# Patient Record
Sex: Female | Born: 1965 | Race: Asian | Hispanic: No | Marital: Married | Smoking: Never smoker
Health system: Southern US, Community
[De-identification: ages and names within clinical notes are randomized; demographics above are authoritative.]

---

## 2014-01-27 ENCOUNTER — Other Ambulatory Visit: Payer: Self-pay | Admitting: Family Medicine

## 2014-01-27 DIAGNOSIS — Z1231 Encounter for screening mammogram for malignant neoplasm of breast: Secondary | ICD-10-CM

## 2014-01-29 ENCOUNTER — Ambulatory Visit (HOSPITAL_COMMUNITY)
Admission: RE | Admit: 2014-01-29 | Discharge: 2014-01-29 | Disposition: A | Discharge status: Home / Self Care | Payer: BC Managed Care – PPO | Source: Ambulatory Visit | Attending: Family Medicine | Admitting: Family Medicine

## 2014-01-29 DIAGNOSIS — Z1231 Encounter for screening mammogram for malignant neoplasm of breast: Secondary | ICD-10-CM | POA: Insufficient documentation

## 2014-11-16 ENCOUNTER — Ambulatory Visit (INDEPENDENT_AMBULATORY_CARE_PROVIDER_SITE_OTHER): Payer: 59 | Admitting: Internal Medicine

## 2014-11-16 VITALS — BP 124/74 | HR 65 | Temp 98.5°F | Resp 16 | Ht 61.0 in | Wt 118.2 lb

## 2014-11-16 DIAGNOSIS — D049 Carcinoma in situ of skin, unspecified: Secondary | ICD-10-CM

## 2014-11-16 DIAGNOSIS — R0789 Other chest pain: Secondary | ICD-10-CM | POA: Diagnosis not present

## 2014-11-16 DIAGNOSIS — D039 Melanoma in situ, unspecified: Secondary | ICD-10-CM

## 2014-11-16 DIAGNOSIS — B029 Zoster without complications: Secondary | ICD-10-CM | POA: Diagnosis not present

## 2014-11-16 MED ORDER — VALACYCLOVIR HCL 1 G PO TABS: 1000.0000 mg | ORAL_TABLET | Freq: Three times a day (TID) | ORAL | Status: DC

## 2014-11-16 MED ORDER — IBUPROFEN 600 MG PO TABS: 600.0000 mg | ORAL_TABLET | Freq: Three times a day (TID) | ORAL | Status: DC | PRN

## 2014-11-16 NOTE — Patient Instructions (Signed)

## 2014-11-16 NOTE — Progress Notes (Signed)
Patient ID: Kaylee Sharp, female   DOB: 24-Nov-1965, 49 y.o.   MRN: [id number]   11/16/2014 at 4:25 PM  Kaylee Sharp / DOB: 24-Nov-1965 / MRN: [id number]  Problem list reviewed and updated by me where necessary.   SUBJECTIVE  Kaylee Sharp is a 49 y.o. well appearing female presenting for the chief complaint of acute painful.rash right axullae. She has single large plaque with many vesicles all intact. She also co 6 months of increasing  broan macules all over back and front.She has no chronic diseases and only takes nexium and claritin prn.   She  has no past medical history on file.    Medications reviewed and updated by myself where necessary, and exist elsewhere in the encounter.   [name] has No Known Allergies. She  reports that she has never smoked. She does not have any smokeless tobacco history on file. She  has no sexual activity history on file. The patient  has no past surgical history on file.  Her family history is not on file.  Review of Systems  Constitutional: Negative for fever, chills and malaise/fatigue.  Respiratory: Negative for shortness of breath.   Cardiovascular: Positive for chest pain.  Gastrointestinal: Negative for nausea.  Skin: Positive for rash.  Neurological: Positive for tingling. Negative for dizziness, weakness and headaches.    OBJECTIVE  Her  height is 5\' 1"  (1.549 m) and weight is 118 lb 3.2 oz (53.615 kg). Her oral temperature is 98.5 F (36.9 C). Her blood pressure is 124/74 and her pulse is 65. Her respiration is 16 and oxygen saturation is 98%.  The patient's body mass index is 22.35 kg/(m^2).  Physical Exam  Constitutional: She is oriented to person, place, and time. She appears well-developed and well-nourished.  HENT:  Head: Normocephalic.  Eyes: Conjunctivae are normal. No scleral icterus.  Neck: Normal range of motion.  Cardiovascular: Normal rate.   Respiratory: Effort normal.  GI: She exhibits no distension.  Musculoskeletal: Normal  range of motion.  Neurological: She is alert and oriented to person, place, and time. Coordination normal.  Skin: Skin is warm and dry.  Psychiatric: She has a normal mood and affect.    No results found for this or any previous visit (from the past 24 hour(s)).  ASSESSMENT & PLAN  Kaylee Sharp was seen today for rash and black spots.  Diagnoses and all orders for this visit:  Herpes zoster -     valACYclovir (VALTREX) 1000 MG tablet; Take 1 tablet (1,000 mg total) by mouth 3 (three) times daily. -     ibuprofen (ADVIL,MOTRIN) 600 MG tablet; Take 1 tablet (600 mg total) by mouth every 8 (eight) hours as needed.  Melanotic freckle -     Ambulatory referral to Dermatology  Right-sided chest wall pain -     valACYclovir (VALTREX) 1000 MG tablet; Take 1 tablet (1,000 mg total) by mouth 3 (three) times daily. -     ibuprofen (ADVIL,MOTRIN) 600 MG tablet; Take 1 tablet (600 mg total) by mouth every 8 (eight) hours as needed.

## 2015-01-13 ENCOUNTER — Ambulatory Visit (INDEPENDENT_AMBULATORY_CARE_PROVIDER_SITE_OTHER): Payer: 59 | Admitting: Family Medicine

## 2015-01-13 ENCOUNTER — Ambulatory Visit (INDEPENDENT_AMBULATORY_CARE_PROVIDER_SITE_OTHER): Payer: 59

## 2015-01-13 VITALS — BP 108/80 | HR 75 | Temp 98.1°F | Resp 18 | Ht 61.0 in | Wt 117.2 lb

## 2015-01-13 DIAGNOSIS — M654 Radial styloid tenosynovitis [de Quervain]: Secondary | ICD-10-CM

## 2015-01-13 DIAGNOSIS — Z23 Encounter for immunization: Secondary | ICD-10-CM | POA: Diagnosis not present

## 2015-01-13 DIAGNOSIS — M25532 Pain in left wrist: Secondary | ICD-10-CM

## 2015-01-13 DIAGNOSIS — Z13220 Encounter for screening for lipoid disorders: Secondary | ICD-10-CM

## 2015-01-13 DIAGNOSIS — M25522 Pain in left elbow: Secondary | ICD-10-CM | POA: Diagnosis not present

## 2015-01-13 DIAGNOSIS — Z131 Encounter for screening for diabetes mellitus: Secondary | ICD-10-CM | POA: Diagnosis not present

## 2015-01-13 LAB — COMPLETE METABOLIC PANEL WITH GFR
ALBUMIN: 4.4 g/dL (ref 3.6–5.1)
ALK PHOS: 41 U/L (ref 33–115)
ALT: 22 U/L (ref 6–29)
AST: 20 U/L (ref 10–35)
BILIRUBIN TOTAL: 0.7 mg/dL (ref 0.2–1.2)
BUN: 8 mg/dL (ref 7–25)
CO2: 27 mmol/L (ref 20–31)
CREATININE: 0.6 mg/dL (ref 0.50–1.10)
Calcium: 9.3 mg/dL (ref 8.6–10.2)
Chloride: 105 mmol/L (ref 98–110)
GFR, Est African American: >89 mL/min (ref >=60)
GFR, Est Non African American: >89 mL/min (ref >=60)
Glucose, Bld: 88 mg/dL (ref 65–99)
Potassium: 4.6 mmol/L (ref 3.5–5.3)
Sodium: 140 mmol/L (ref 135–146)
Total Protein: 6.9 g/dL (ref 6.1–8.1)

## 2015-01-13 LAB — LIPID PANEL
Cholesterol: 210 mg/dL — ABNORMAL HIGH (ref 125–200)
HDL: 49 mg/dL (ref >=46)
LDL CALC: 123 mg/dL (ref <130)
TRIGLYCERIDES: 188 mg/dL — AB (ref <150)
Total CHOL/HDL Ratio: 4.3 Ratio (ref <=5.0)
VLDL: 38 mg/dL — ABNORMAL HIGH (ref <30)

## 2015-01-13 MED ORDER — MELOXICAM 7.5 MG PO TABS: 7.5000 mg | ORAL_TABLET | Freq: Every day | ORAL | Status: AC

## 2015-01-13 NOTE — Progress Notes (Signed)
Subjective:  By signing my name below, I, Kaylee Sharp, attest that this documentation has been prepared under the direction and in the presence of Meredith Staggers, MD.  Broadus John, Medical Scribe. 01/13/2015.  12:10 PM.   Patient ID: Kaylee Sharp, female    DOB: [birth year], 49 y.o.   MRN: [id number]  Chief Complaint  Patient presents with  . Arm Pain    left arm pain, x 2 months  . Flu Vaccine  . Other    requesting blood work    HPI HPI Comments: Kaylee Sharp is a 49 y.o. female who presents to [facility] complaining of left arm pain, starting onset 2 months ago.  She states that she hit her arm with an object initially, and on another incident she hit her elbow on the same arm. She indicates that she is experiencing a worsening soreness of the area that is resulting in a mild weakness of the area. She states that the pain radiates to her upper arm, however not to the shoulder or the neck, and also notes pain with pushing off on her left wrist. . Pt is right hand dominant. She denies working on a computer for a long periods of time.   Pt is requesting blood work to be done on her for a regular check up for cholesterol and diabetes . Pt does not have a PCP, she does not recall the last time she had a physical exam done.  She is in a fasting state today.    Pt is requesting flu vaccine.  Pt works in [employer].   Pt does have a language barrier, her first language is [place], however she voiced understanding of the treatment plan.    There are no active problems to display for this patient.  History reviewed. No pertinent past medical history. History reviewed. No pertinent past surgical history. No Known Allergies Prior to Admission medications   Medication Sig Start Date End Date Taking? Authorizing Provider  esomeprazole (NEXIUM) 20 MG packet Take 20 mg by mouth daily before breakfast.   Yes Historical Provider, MD  ibuprofen (ADVIL,MOTRIN) 600 MG tablet  Take 1 tablet (600 mg total) by mouth every 8 (eight) hours as needed. Patient not taking: Reported on 01/13/2015 11/16/14   Jonita Albee, MD  valACYclovir (VALTREX) 1000 MG tablet Take 1 tablet (1,000 mg total) by mouth 3 (three) times daily. Patient not taking: Reported on 01/13/2015 11/16/14   Jonita Albee, MD   Social History   Social History  . Marital Status: Married    Spouse Name: N/A  . Number of Children: N/A  . Years of Education: N/A   Occupational History  . Not on file.   Social History Main Topics  . Smoking status: Never Smoker   . Smokeless tobacco: Not on file  . Alcohol Use: Not on file  . Drug Use: Not on file  . Sexual Activity: Not on file   Other Topics Concern  . Not on file   Social History Narrative    Review of Systems  Musculoskeletal: Positive for myalgias and arthralgias. Negative for neck pain.  Neurological: Positive for weakness.      Objective:   Physical Exam  Constitutional: She is oriented to person, place, and time. She appears well-developed and well-nourished. No distress.  HENT:  Head: Normocephalic and atraumatic.  Eyes: EOM are normal. Pupils are equal, round, and reactive to light.  Neck: Neck supple.  Cardiovascular:  Normal rate.   Pulmonary/Chest: Effort normal.  Musculoskeletal:  FROM. Reproduction of upper shoulder and upper back pain, she does not necessarily feel the pain radiating to her left arm, but tenderness over the left trapezius and left lateral neck. Left shoulder- FROM. Full rotator cuffs strength. Full strength on the left upper extremity compared to her right. Full grip strength.  Left hand- no focal bony tenderness including scaphoid on the thump. Negative tinel's. FROM. Positive finkelsteins. Globally points to the posterior elbow for pain, but FROM. No bony tenderness on my exam.   Neurological: She is alert and oriented to person, place, and time. No cranial nerve deficit.  Skin: Skin is warm and dry.    Psychiatric: She has a normal mood and affect. Her behavior is normal.  Nursing note and vitals reviewed.   Filed Vitals:   01/13/15 1134  BP: 108/80  Pulse: 75  Temp: 98.1 F (36.7 C)  TempSrc: Oral  Resp: 18  Height: 5\' 1"  (1.549 m)  Weight: 117 lb 3.2 oz (53.162 kg)  SpO2: 98%    [facility] (PRIMARY) x-ray report read by Dr. Meredith StaggersJeffrey Skyra Crichlow, MD: Left elbow- no apparent fracture, or significant fat pad.  Left wrist- no fracture. No acute findings.      Assessment & Plan:   Kaylee SlickerJuhee Kawano is a [age] y.o. female Left wrist pain Tenosynovitis, de Quervain - Plan: Care order/instruction, meloxicam (MOBIC) 7.5 MG tablet  -Possible initial contusion, but symptoms and exam suggests more de Quervain's disease. Thumb spica splint placed temporarily, range of motion 2 times per day, RTC precautions if not improving the next 2 weeks.  Left elbow pain - Plan: DG Elbow Complete Left, meloxicam (MOBIC) 7.5 MG tablet  -No apparent fracture on x-ray, possible initial contusion. Symptomatic care with meloxicam, follow-up in 2 weeks if not improved then would consider ortho evaluation.  Screening for hyperlipidemia - Plan: Lipid panel  Screening for diabetes mellitus - Plan: COMPLETE METABOLIC PANEL WITH GFR  Flu vaccine need - Plan: Flu Vaccine QUAD 36+ mos IM given.  Screening labs drawn above at her request, but recommended scheduling physical to review remainder of health maintenance or necessary screening tests.     Meds ordered this encounter  Medications  . meloxicam (MOBIC) 7.5 MG tablet    Sig: Take 1 tablet (7.5 mg total) by mouth daily.    Dispense:  30 tablet    Refill:  0   Patient Instructions  You should receive a call or letter about your lab results within the next week to 10 days.  Schedule a physical for other health maintenance besides screening for diabetes and cholesterol that we checked today.  Meloxicam once per day as needed for your elbow, wrist, arm pain. Okay to use  the brace for up to 2 weeks for your left wrist, but come out of the brace at least twice per day for stretches and range of motion. Range of motion or stretches for your neck and heat to the back of your neck and upper back as needed. Recheck in 2 weeks if not better, sooner if worse  Flu vaccine given today.   Return to the clinic or go to the nearest emergency room if any of your symptoms worsen or new symptoms occur.  Lollie Sailse Quervain Disease Lollie Sailse Quervain disease is inflammation of the tendon on the thumb side of the wrist. Tendons are cords of tissue that connect bones to muscles. The tendons in your hand pass through a tunnel,  or sheath. A slippery layer of tissue (synovium) lets the tendons move smoothly in the sheath. With de Quervain disease, the sheath swells or thickens, causing friction and pain. The condition is also called de Quervain tendinosis and de Quervain syndrome. It occurs most often in women who are 26-62 years old. CAUSES  The exact cause of de Quervain disease is not known. It may result from:   Overusing your hands, especially with repetitive motions that involve twisting your hand or using a forceful grip.  Pregnancy.  Rheumatoid disease. RISK FACTORS You may have a greater risk for de Quervain disease if you:  Are a middle-aged woman.  Are pregnant.  Have rheumatoid arthritis.  Have diabetes.  Use your hands far more than normal, especially with a tight grip or excessive twisting. SIGNS AND SYMPTOMS Pain on the thumb side of your wrist is the main symptom of de Quervain disease. Other signs and symptoms include:  Pain that gets worse when you grasp something or turn your wrist.  Pain that extends up the forearm.  Cysts in the area of the pain.  Swelling of your wrist and hand.  A sensation of snapping in the wrist.  Trouble moving the thumb and wrist. DIAGNOSIS  Your health care provider may diagnose de Quervain disease based on your signs and  symptoms. A physical exam will also be done. A simple test Lourena Simmonds test) that involves pulling your thumb and wrist to see if this causes pain can help determine whether you have the condition. Sometimes you may need to have an X-ray.  TREATMENT  Avoiding any activity that causes pain and swelling is the best treatment. Other options include:  Wearing a splint.  Taking medicine. Anti-inflammatory medicines and corticosteroid injections may reduce inflammation and relieve pain.  Having surgery if other treatments do not work. HOME CARE INSTRUCTIONS   Using ice can be helpful after doing activities that involve the sore wrist. To apply ice to the injured area:  Put ice in a plastic bag.  Place a towel between your skin and the bag.  Leave the ice on for 20 minutes, 2-3 times a day.  Take medicines only as directed by your health care provider.  Wear your splint as directed. This will allow your hand to rest and heal. SEEK MEDICAL CARE IF:   Your pain medicine does not help.   Your pain gets worse.  You develop new symptoms. MAKE SURE YOU:   Understand these instructions.  Will watch your condition.  Will get help right away if you are not doing well or get worse.   This information is not intended to replace advice given to you by your health care provider. Make sure you discuss any questions you have with your health care provider.   Document Released: 10/12/2000 Document Revised: 02/07/2014 Document Reviewed: 05/22/2013 Elsevier Interactive Patient Education 2016 Elsevier Inc.   Elbow Contusion An elbow contusion is a deep bruise of the elbow. Contusions are the result of an injury that caused bleeding under the skin. The contusion may turn blue, purple, or yellow. Minor injuries will give you a painless contusion, but more severe contusions may stay painful and swollen for a few weeks.  CAUSES  An elbow contusion comes from a direct force to that area, such as  falling on the elbow. SYMPTOMS   Swelling and redness of the elbow.  Bruising of the elbow area.  Tenderness or soreness of the elbow. DIAGNOSIS  You will have a  physical exam and will be asked about your history. You may need an X-ray of your elbow to look for a broken bone (fracture).  TREATMENT  A sling or splint may be needed to support your injury. Resting, elevating, and applying cold compresses to the elbow area are often the best treatments for an elbow contusion. Over-the-counter medicines may also be recommended for pain control. HOME CARE INSTRUCTIONS   Put ice on the injured area.  Put ice in a plastic bag.  Place a towel between your skin and the bag.  Leave the ice on for 15-20 minutes, 03-04 times a day.  Only take over-the-counter or prescription medicines for pain, discomfort, or fever as directed by your caregiver.  Rest your injured elbow until the pain and swelling are better.  Elevate your elbow to reduce swelling.  Apply a compression wrap as directed by your caregiver. This can help reduce swelling and motion. You may remove the wrap for sleeping, showers, and baths. If your fingers become numb, cold, or blue, take the wrap off and reapply it more loosely.  Use your elbow only as directed by your caregiver. You may be asked to do range of motion exercises. Do them as directed.  See your caregiver as directed. It is very important to keep all follow-up appointments in order to avoid any long-term problems with your elbow, including chronic pain or inability to move your elbow normally. SEEK IMMEDIATE MEDICAL CARE IF:   You have increased redness, swelling, or pain in your elbow.  Your swelling or pain is not relieved with medicines.  You have swelling of the hand and fingers.  You are unable to move your fingers or wrist.  You begin to lose feeling in your hand or fingers.  Your fingers or hand become cold or blue. MAKE SURE YOU:   Understand  these instructions.  Will watch your condition.  Will get help right away if you are not doing well or get worse.   This information is not intended to replace advice given to you by your health care provider. Make sure you discuss any questions you have with your health care provider.   Document Released: 12/26/2005 Document Revised: 04/11/2011 Document Reviewed: 09/01/2014 Elsevier Interactive Patient Education 2016 Elsevier Inc. Influenza Virus Vaccine injection What is this medicine? INFLUENZA VIRUS VACCINE (in floo EN zuh VAHY ruhs vak SEEN) helps to reduce the risk of getting influenza also known as the flu. The vaccine only helps protect you against some strains of the flu. This medicine may be used for other purposes; ask your health care provider or pharmacist if you have questions. What should I tell my health care provider before I take this medicine? They need to know if you have any of these conditions: -bleeding disorder like hemophilia -fever or infection -Guillain-Barre syndrome or other neurological problems -immune system problems -infection with the human immunodeficiency virus (HIV) or AIDS -low blood platelet counts -multiple sclerosis -an unusual or allergic reaction to influenza virus vaccine, latex, other medicines, foods, dyes, or preservatives. Different brands of vaccines contain different allergens. Some may contain latex or eggs. Talk to your doctor about your allergies to make sure that you get the right vaccine. -pregnant or trying to get pregnant -breast-feeding How should I use this medicine? This vaccine is for injection into a muscle or under the skin. It is given by a health care professional. A copy of Vaccine Information Statements will be given before each vaccination. Read this sheet  carefully each time. The sheet may change frequently. Talk to your healthcare provider to see which vaccines are right for you. Some vaccines should not be used in  all age groups. Overdosage: If you think you have taken too much of this medicine contact a poison control center or emergency room at once. NOTE: This medicine is only for you. Do not share this medicine with others. What if I miss a dose? This does not apply. What may interact with this medicine? -chemotherapy or radiation therapy -medicines that lower your immune system like etanercept, anakinra, infliximab, and adalimumab -medicines that treat or prevent blood clots like warfarin -phenytoin -steroid medicines like prednisone or cortisone -theophylline -vaccines This list may not describe all possible interactions. Give your health care provider a list of all the medicines, herbs, non-prescription drugs, or dietary supplements you use. Also tell them if you smoke, drink alcohol, or use illegal drugs. Some items may interact with your medicine. What should I watch for while using this medicine? Report any side effects that do not go away within 3 days to your doctor or health care professional. Call your health care provider if any unusual symptoms occur within 6 weeks of receiving this vaccine. You may still catch the flu, but the illness is not usually as bad. You cannot get the flu from the vaccine. The vaccine will not protect against colds or other illnesses that may cause fever. The vaccine is needed every year. What side effects may I notice from receiving this medicine? Side effects that you should report to your doctor or health care professional as soon as possible: -allergic reactions like skin rash, itching or hives, swelling of the face, lips, or tongue Side effects that usually do not require medical attention (report to your doctor or health care professional if they continue or are bothersome): -fever -headache -muscle aches and pains -pain, tenderness, redness, or swelling at the injection site -tiredness This list may not describe all possible side effects. Call your  doctor for medical advice about side effects. You may report side effects to FDA at 1-800-FDA-1088. Where should I keep my medicine? The vaccine will be given by a health care professional in a clinic, pharmacy, doctor's office, or other health care setting. You will not be given vaccine doses to store at home. NOTE: This sheet is a summary. It may not cover all possible information. If you have questions about this medicine, talk to your doctor, pharmacist, or health care provider.    2016, Elsevier/Gold Standard. (2014-08-08 10:07:28)     I personally performed the services described in this documentation, which was scribed in my presence. The recorded information has been reviewed and considered, and addended by me as needed.

## 2015-01-13 NOTE — Patient Instructions (Addendum)
You should receive a call or letter about your lab results within the next week to 10 days.  Schedule a physical for other health maintenance besides screening for diabetes and cholesterol that we checked today.  Meloxicam once per day as needed for your elbow, wrist, arm pain. Okay to use the brace for up to 2 weeks for your left wrist, but come out of the brace at least twice per day for stretches and range of motion. Range of motion or stretches for your neck and heat to the back of your neck and upper back as needed. Recheck in 2 weeks if not better, sooner if worse  Flu vaccine given today.   Return to the clinic or go to the nearest emergency room if any of your symptoms worsen or new symptoms occur.  Lollie Sails Disease Lollie Sails disease is inflammation of the tendon on the thumb side of the wrist. Tendons are cords of tissue that connect bones to muscles. The tendons in your hand pass through a tunnel, or sheath. A slippery layer of tissue (synovium) lets the tendons move smoothly in the sheath. With de Quervain disease, the sheath swells or thickens, causing friction and pain. The condition is also called de Quervain tendinosis and de Quervain syndrome. It occurs most often in women who are 85-46 years old. CAUSES  The exact cause of de Quervain disease is not known. It may result from:   Overusing your hands, especially with repetitive motions that involve twisting your hand or using a forceful grip.  Pregnancy.  Rheumatoid disease. RISK FACTORS You may have a greater risk for de Quervain disease if you:  Are a middle-aged woman.  Are pregnant.  Have rheumatoid arthritis.  Have diabetes.  Use your hands far more than normal, especially with a tight grip or excessive twisting. SIGNS AND SYMPTOMS Pain on the thumb side of your wrist is the main symptom of de Quervain disease. Other signs and symptoms include:  Pain that gets worse when you grasp something or turn your  wrist.  Pain that extends up the forearm.  Cysts in the area of the pain.  Swelling of your wrist and hand.  A sensation of snapping in the wrist.  Trouble moving the thumb and wrist. DIAGNOSIS  Your health care provider may diagnose de Quervain disease based on your signs and symptoms. A physical exam will also be done. A simple test Lourena Simmonds test) that involves pulling your thumb and wrist to see if this causes pain can help determine whether you have the condition. Sometimes you may need to have an X-ray.  TREATMENT  Avoiding any activity that causes pain and swelling is the best treatment. Other options include:  Wearing a splint.  Taking medicine. Anti-inflammatory medicines and corticosteroid injections may reduce inflammation and relieve pain.  Having surgery if other treatments do not work. HOME CARE INSTRUCTIONS   Using ice can be helpful after doing activities that involve the sore wrist. To apply ice to the injured area:  Put ice in a plastic bag.  Place a towel between your skin and the bag.  Leave the ice on for 20 minutes, 2-3 times a day.  Take medicines only as directed by your health care provider.  Wear your splint as directed. This will allow your hand to rest and heal. SEEK MEDICAL CARE IF:   Your pain medicine does not help.   Your pain gets worse.  You develop new symptoms. MAKE SURE YOU:  Understand these instructions.  Will watch your condition.  Will get help right away if you are not doing well or get worse.   This information is not intended to replace advice given to you by your health care provider. Make sure you discuss any questions you have with your health care provider.   Document Released: 10/12/2000 Document Revised: 02/07/2014 Document Reviewed: 05/22/2013 Elsevier Interactive Patient Education 2016 [employer]   Elbow Contusion An elbow contusion is a deep bruise of the elbow. Contusions are the result of an injury  that caused bleeding under the skin. The contusion may turn blue, purple, or yellow. Minor injuries will give you a painless contusion, but more severe contusions may stay painful and swollen for a few weeks.  CAUSES  An elbow contusion comes from a direct force to that area, such as falling on the elbow. SYMPTOMS   Swelling and redness of the elbow.  Bruising of the elbow area.  Tenderness or soreness of the elbow. DIAGNOSIS  You will have a physical exam and will be asked about your history. You may need an X-ray of your elbow to look for a broken bone (fracture).  TREATMENT  A sling or splint may be needed to support your injury. Resting, elevating, and applying cold compresses to the elbow area are often the best treatments for an elbow contusion. Over-the-counter medicines may also be recommended for pain control. HOME CARE INSTRUCTIONS   Put ice on the injured area.  Put ice in a plastic bag.  Place a towel between your skin and the bag.  Leave the ice on for 15-20 minutes, 03-04 times a day.  Only take over-the-counter or prescription medicines for pain, discomfort, or fever as directed by your caregiver.  Rest your injured elbow until the pain and swelling are better.  Elevate your elbow to reduce swelling.  Apply a compression wrap as directed by your caregiver. This can help reduce swelling and motion. You may remove the wrap for sleeping, showers, and baths. If your fingers become numb, cold, or blue, take the wrap off and reapply it more loosely.  Use your elbow only as directed by your caregiver. You may be asked to do range of motion exercises. Do them as directed.  See your caregiver as directed. It is very important to keep all follow-up appointments in order to avoid any long-term problems with your elbow, including chronic pain or inability to move your elbow normally. SEEK IMMEDIATE MEDICAL CARE IF:   You have increased redness, swelling, or pain in your  elbow.  Your swelling or pain is not relieved with medicines.  You have swelling of the hand and fingers.  You are unable to move your fingers or wrist.  You begin to lose feeling in your hand or fingers.  Your fingers or hand become cold or blue. MAKE SURE YOU:   Understand these instructions.  Will watch your condition.  Will get help right away if you are not doing well or get worse.   This information is not intended to replace advice given to you by your health care provider. Make sure you discuss any questions you have with your health care provider.   Document Released: 12/26/2005 Document Revised: 04/11/2011 Document Reviewed: 09/01/2014 Elsevier Interactive Patient Education 2016 [employer] Influenza Virus Vaccine injection What is this medicine? INFLUENZA VIRUS VACCINE (in floo EN zuh VAHY ruhs vak SEEN) helps to reduce the risk of getting influenza also known as the flu. The vaccine  only helps protect you against some strains of the flu. This medicine may be used for other purposes; ask your health care provider or pharmacist if you have questions. What should I tell my health care provider before I take this medicine? They need to know if you have any of these conditions: -bleeding disorder like hemophilia -fever or infection -Guillain-Barre syndrome or other neurological problems -immune system problems -infection with the human immunodeficiency virus (HIV) or AIDS -low blood platelet counts -multiple sclerosis -an unusual or allergic reaction to influenza virus vaccine, latex, other medicines, foods, dyes, or preservatives. Different brands of vaccines contain different allergens. Some may contain latex or eggs. Talk to your doctor about your allergies to make sure that you get the right vaccine. -pregnant or trying to get pregnant -breast-feeding How should I use this medicine? This vaccine is for injection into a muscle or under the skin. It is given by a  health care professional. A copy of Vaccine Information Statements will be given before each vaccination. Read this sheet carefully each time. The sheet may change frequently. Talk to your healthcare provider to see which vaccines are right for you. Some vaccines should not be used in all age groups. Overdosage: If you think you have taken too much of this medicine contact a poison control center or emergency room at once. NOTE: This medicine is only for you. Do not share this medicine with others. What if I miss a dose? This does not apply. What may interact with this medicine? -chemotherapy or radiation therapy -medicines that lower your immune system like etanercept, anakinra, infliximab, and adalimumab -medicines that treat or prevent blood clots like warfarin -phenytoin -steroid medicines like prednisone or cortisone -theophylline -vaccines This list may not describe all possible interactions. Give your health care provider a list of all the medicines, herbs, non-prescription drugs, or dietary supplements you use. Also tell them if you smoke, drink alcohol, or use illegal drugs. Some items may interact with your medicine. What should I watch for while using this medicine? Report any side effects that do not go away within 3 days to your doctor or health care professional. Call your health care provider if any unusual symptoms occur within 6 weeks of receiving this vaccine. You may still catch the flu, but the illness is not usually as bad. You cannot get the flu from the vaccine. The vaccine will not protect against colds or other illnesses that may cause fever. The vaccine is needed every year. What side effects may I notice from receiving this medicine? Side effects that you should report to your doctor or health care professional as soon as possible: -allergic reactions like skin rash, itching or hives, swelling of the face, lips, or tongue Side effects that usually do not require  medical attention (report to your doctor or health care professional if they continue or are bothersome): -fever -headache -muscle aches and pains -pain, tenderness, redness, or swelling at the injection site -tiredness This list may not describe all possible side effects. Call your doctor for medical advice about side effects. You may report side effects to FDA at 1-800-FDA-1088. Where should I keep my medicine? The vaccine will be given by a health care professional in a clinic, pharmacy, doctor's office, or other health care setting. You will not be given vaccine doses to store at home. NOTE: This sheet is a summary. It may not cover all possible information. If you have questions about this medicine, talk to your doctor, pharmacist,  or health care provider.    2016, [employer]. (2014-08-08 10:07:28)

## 2017-02-17 IMAGING — CR DG WRIST COMPLETE 3+V*L*
2 series · 2 of 2 positions shown · non-contrast
Comparison: None.

CLINICAL DATA: Left wrist pain following blunt trauma, initial
encounter

EXAM:
LEFT WRIST - COMPLETE 3+ VIEW

[PA]
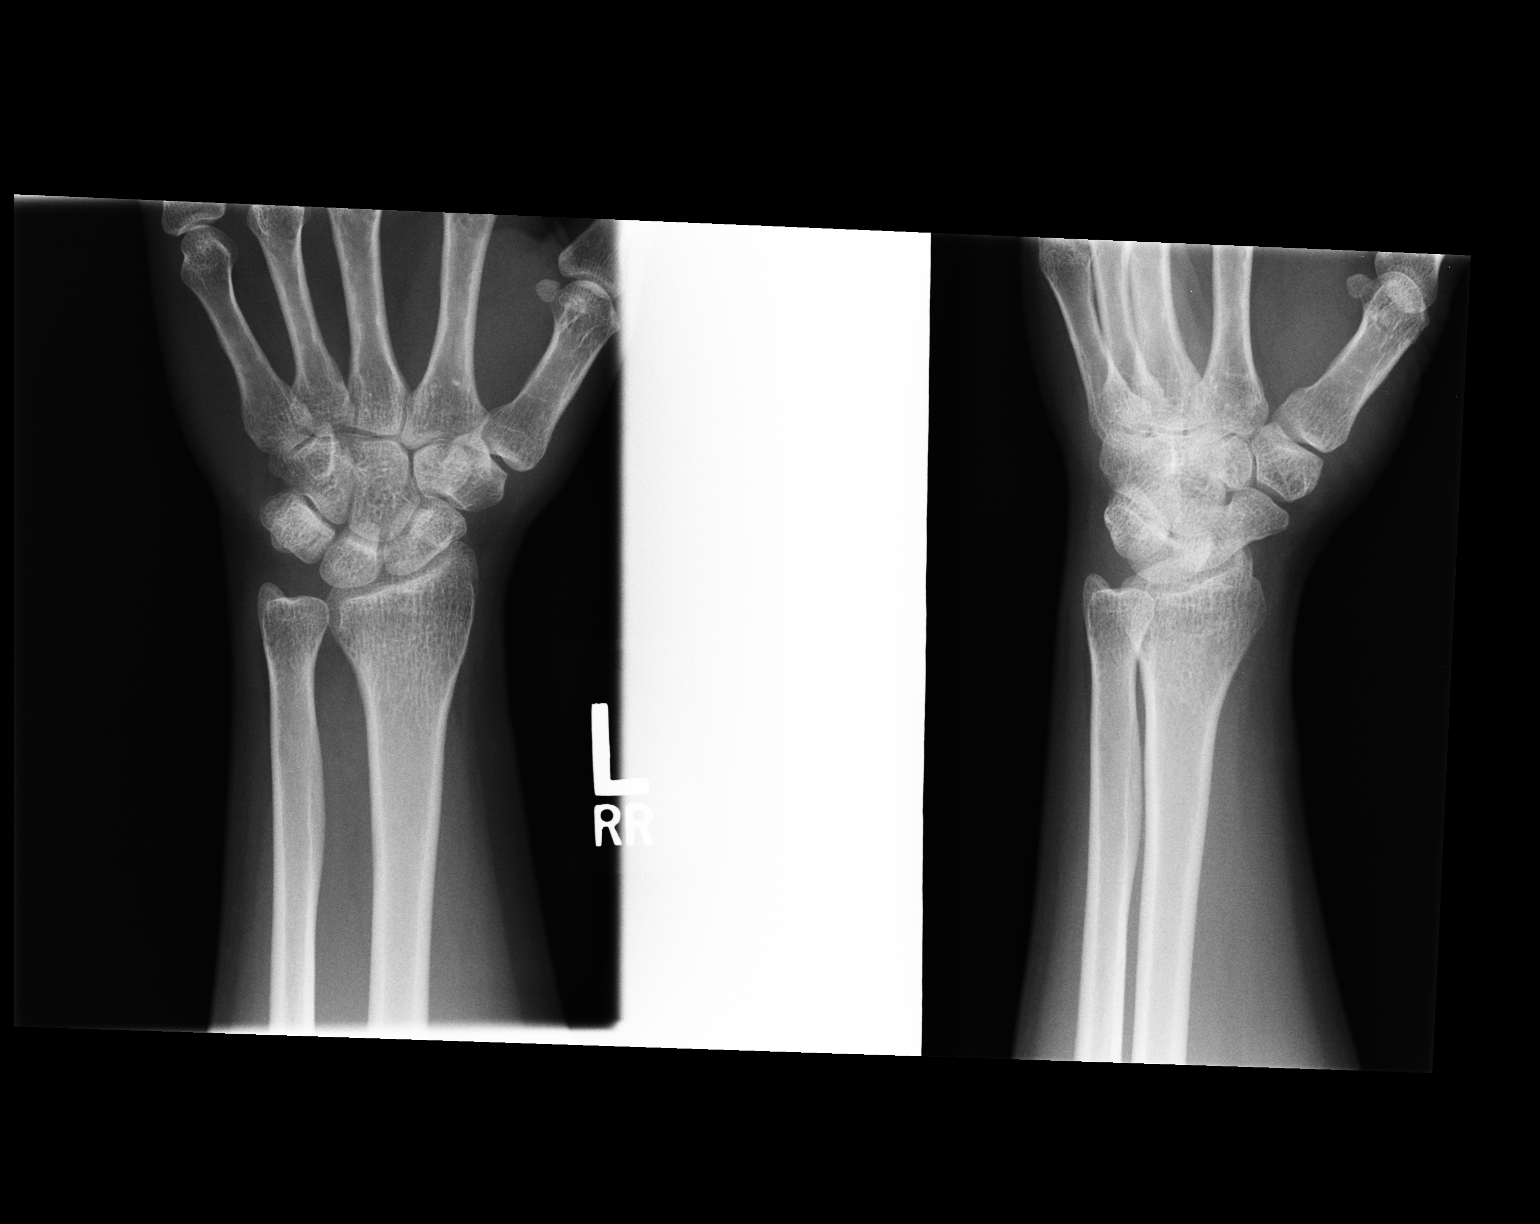

[lateral]
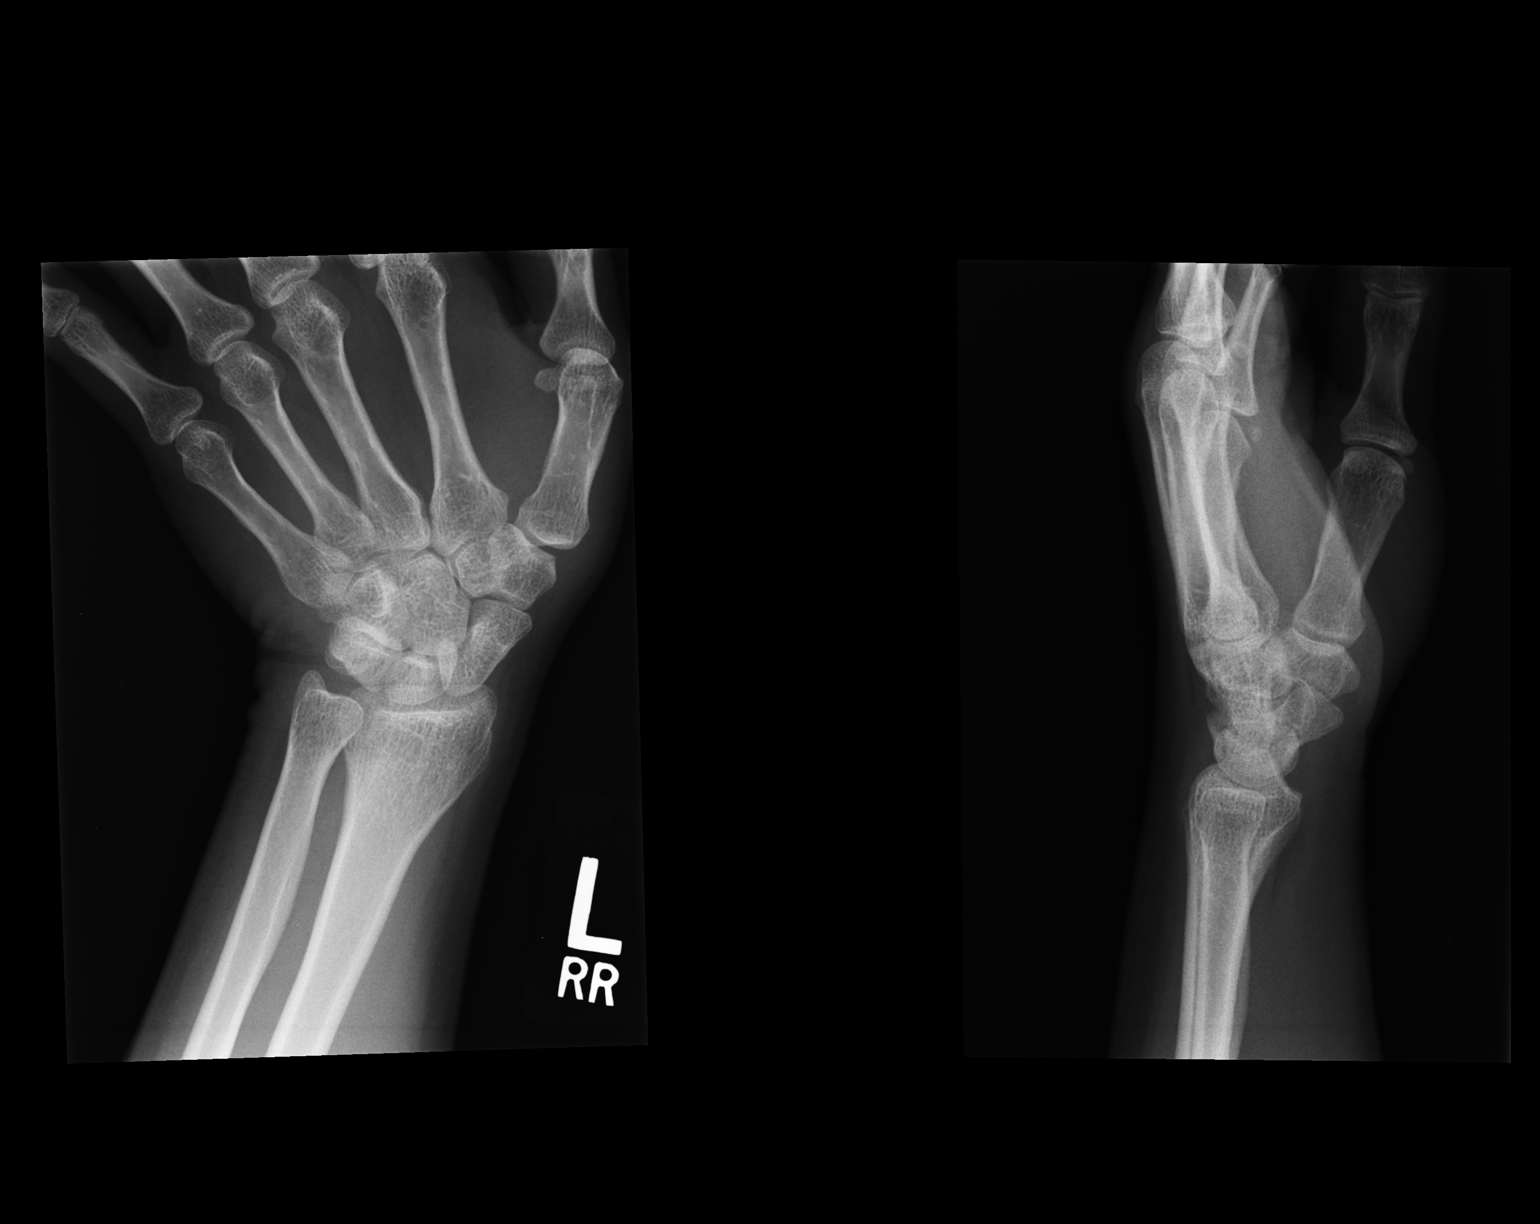

[2 of 2 positions shown; findings below may reference images not displayed]

FINDINGS: There is no evidence of fracture or dislocation. There is no
evidence of arthropathy or other focal bone abnormality. Soft
tissues are unremarkable.
IMPRESSION: No acute abnormality noted.

## 2021-11-25 ENCOUNTER — Other Ambulatory Visit: Payer: Self-pay | Admitting: Obstetrics and Gynecology

## 2021-11-25 DIAGNOSIS — R928 Other abnormal and inconclusive findings on diagnostic imaging of breast: Secondary | ICD-10-CM

## 2021-12-29 ENCOUNTER — Ambulatory Visit
Admission: RE | Admit: 2021-12-29 | Discharge: 2021-12-29 | Disposition: A | Discharge status: Home / Self Care | Payer: BC Managed Care – PPO | Source: Ambulatory Visit | Attending: Obstetrics and Gynecology | Admitting: Obstetrics and Gynecology

## 2021-12-29 ENCOUNTER — Other Ambulatory Visit: Payer: Self-pay | Admitting: Obstetrics and Gynecology

## 2021-12-29 DIAGNOSIS — R928 Other abnormal and inconclusive findings on diagnostic imaging of breast: Secondary | ICD-10-CM
